# Patient Record
Sex: Female | Born: 1960 | Race: White | Hispanic: No | Marital: Single | Smoking: Current every day smoker
Health system: Southern US, Community
[De-identification: ages and names within clinical notes are randomized; demographics above are authoritative.]

## PROBLEM LIST (undated history)

## (undated) DIAGNOSIS — I10 Essential (primary) hypertension: Secondary | ICD-10-CM

## (undated) DIAGNOSIS — M069 Rheumatoid arthritis, unspecified: Secondary | ICD-10-CM

## (undated) DIAGNOSIS — F17200 Nicotine dependence, unspecified, uncomplicated: Secondary | ICD-10-CM

## (undated) DIAGNOSIS — I2510 Atherosclerotic heart disease of native coronary artery without angina pectoris: Secondary | ICD-10-CM

## (undated) DIAGNOSIS — E7800 Pure hypercholesterolemia, unspecified: Secondary | ICD-10-CM

## (undated) HISTORY — DX: Atherosclerotic heart disease of native coronary artery without angina pectoris: I25.10

## (undated) HISTORY — PX: ABDOMINAL HYSTERECTOMY: SHX81

## (undated) HISTORY — DX: Pure hypercholesterolemia, unspecified: E78.00

## (undated) HISTORY — DX: Nicotine dependence, unspecified, uncomplicated: F17.200

## (undated) HISTORY — PX: CERVICAL FUSION: SHX112

---

## 2001-03-20 ENCOUNTER — Ambulatory Visit (HOSPITAL_COMMUNITY): Admission: RE | Admit: 2001-03-20 | Discharge: 2001-03-20 | Payer: Self-pay | Admitting: *Deleted

## 2001-03-20 ENCOUNTER — Encounter: Payer: Self-pay | Admitting: *Deleted

## 2001-04-16 ENCOUNTER — Other Ambulatory Visit: Admission: RE | Admit: 2001-04-16 | Discharge: 2001-04-16 | Payer: Self-pay | Admitting: *Deleted

## 2001-04-22 ENCOUNTER — Other Ambulatory Visit: Admission: RE | Admit: 2001-04-22 | Discharge: 2001-04-22 | Payer: Self-pay | Admitting: General Surgery

## 2001-04-23 ENCOUNTER — Encounter: Payer: Self-pay | Admitting: General Surgery

## 2001-04-23 ENCOUNTER — Ambulatory Visit (HOSPITAL_COMMUNITY): Admission: RE | Admit: 2001-04-23 | Discharge: 2001-04-23 | Payer: Self-pay | Admitting: Pediatrics

## 2002-04-10 ENCOUNTER — Encounter: Payer: Self-pay | Admitting: Family Medicine

## 2002-04-10 ENCOUNTER — Ambulatory Visit (HOSPITAL_COMMUNITY): Admission: RE | Admit: 2002-04-10 | Discharge: 2002-04-10 | Payer: Self-pay | Admitting: Family Medicine

## 2002-04-23 ENCOUNTER — Other Ambulatory Visit: Admission: RE | Admit: 2002-04-23 | Discharge: 2002-04-23 | Payer: Self-pay | Admitting: Obstetrics & Gynecology

## 2003-02-12 ENCOUNTER — Encounter: Payer: Self-pay | Admitting: General Surgery

## 2003-02-12 ENCOUNTER — Ambulatory Visit (HOSPITAL_COMMUNITY): Admission: RE | Admit: 2003-02-12 | Discharge: 2003-02-12 | Payer: Self-pay | Admitting: General Surgery

## 2003-06-02 ENCOUNTER — Encounter: Payer: Self-pay | Admitting: Obstetrics & Gynecology

## 2003-06-02 ENCOUNTER — Ambulatory Visit (HOSPITAL_COMMUNITY): Admission: RE | Admit: 2003-06-02 | Discharge: 2003-06-02 | Payer: Self-pay | Admitting: Obstetrics & Gynecology

## 2004-06-09 ENCOUNTER — Ambulatory Visit (HOSPITAL_COMMUNITY): Admission: RE | Admit: 2004-06-09 | Discharge: 2004-06-09 | Payer: Self-pay | Admitting: Obstetrics & Gynecology

## 2005-07-06 ENCOUNTER — Emergency Department (HOSPITAL_COMMUNITY): Admission: EM | Admit: 2005-07-06 | Discharge: 2005-07-06 | Payer: Self-pay | Admitting: Emergency Medicine

## 2005-07-16 ENCOUNTER — Ambulatory Visit: Payer: Self-pay | Admitting: Orthopedic Surgery

## 2005-07-18 ENCOUNTER — Ambulatory Visit (HOSPITAL_COMMUNITY): Admission: RE | Admit: 2005-07-18 | Discharge: 2005-07-18 | Payer: Self-pay | Admitting: Family Medicine

## 2006-04-08 ENCOUNTER — Emergency Department (HOSPITAL_COMMUNITY): Admission: EM | Admit: 2006-04-08 | Discharge: 2006-04-08 | Payer: Self-pay | Admitting: Emergency Medicine

## 2006-09-20 ENCOUNTER — Ambulatory Visit: Payer: Self-pay | Admitting: Internal Medicine

## 2006-09-20 ENCOUNTER — Encounter (INDEPENDENT_AMBULATORY_CARE_PROVIDER_SITE_OTHER): Payer: Self-pay | Admitting: Specialist

## 2006-09-20 ENCOUNTER — Ambulatory Visit (HOSPITAL_COMMUNITY): Admission: RE | Admit: 2006-09-20 | Discharge: 2006-09-20 | Payer: Self-pay | Admitting: Internal Medicine

## 2006-09-26 ENCOUNTER — Ambulatory Visit (HOSPITAL_COMMUNITY): Admission: RE | Admit: 2006-09-26 | Discharge: 2006-09-26 | Payer: Self-pay | Admitting: Obstetrics & Gynecology

## 2007-05-06 ENCOUNTER — Emergency Department (HOSPITAL_COMMUNITY): Admission: EM | Admit: 2007-05-06 | Discharge: 2007-05-06 | Payer: Self-pay | Admitting: Emergency Medicine

## 2007-10-03 ENCOUNTER — Ambulatory Visit (HOSPITAL_COMMUNITY): Admission: RE | Admit: 2007-10-03 | Discharge: 2007-10-03 | Payer: Self-pay | Admitting: Obstetrics & Gynecology

## 2008-05-03 ENCOUNTER — Encounter (INDEPENDENT_AMBULATORY_CARE_PROVIDER_SITE_OTHER): Payer: Self-pay | Admitting: Family Medicine

## 2008-05-03 ENCOUNTER — Ambulatory Visit (HOSPITAL_COMMUNITY): Admission: RE | Admit: 2008-05-03 | Discharge: 2008-05-03 | Payer: Self-pay | Admitting: Family Medicine

## 2008-05-03 ENCOUNTER — Ambulatory Visit: Payer: Self-pay | Admitting: Cardiology

## 2008-06-02 ENCOUNTER — Ambulatory Visit (HOSPITAL_COMMUNITY): Payer: Self-pay | Admitting: Oncology

## 2008-10-13 ENCOUNTER — Ambulatory Visit (HOSPITAL_COMMUNITY): Admission: RE | Admit: 2008-10-13 | Discharge: 2008-10-13 | Payer: Self-pay | Admitting: Obstetrics & Gynecology

## 2009-10-24 ENCOUNTER — Ambulatory Visit (HOSPITAL_COMMUNITY): Admission: RE | Admit: 2009-10-24 | Discharge: 2009-10-24 | Payer: Self-pay | Admitting: Obstetrics & Gynecology

## 2010-06-14 ENCOUNTER — Encounter: Admission: RE | Admit: 2010-06-14 | Discharge: 2010-06-14 | Payer: Self-pay | Admitting: Neurosurgery

## 2010-08-21 ENCOUNTER — Encounter
Admission: RE | Admit: 2010-08-21 | Discharge: 2010-08-21 | Payer: Self-pay | Source: Home / Self Care | Attending: Neurosurgery | Admitting: Neurosurgery

## 2010-11-20 ENCOUNTER — Other Ambulatory Visit (HOSPITAL_COMMUNITY): Payer: Self-pay | Admitting: Obstetrics & Gynecology

## 2010-11-20 DIAGNOSIS — Z139 Encounter for screening, unspecified: Secondary | ICD-10-CM

## 2010-11-24 ENCOUNTER — Ambulatory Visit (HOSPITAL_COMMUNITY)
Admission: RE | Admit: 2010-11-24 | Discharge: 2010-11-24 | Disposition: A | Discharge status: Home / Self Care | Payer: 59 | Source: Ambulatory Visit | Attending: Obstetrics & Gynecology | Admitting: Obstetrics & Gynecology

## 2010-11-24 DIAGNOSIS — Z139 Encounter for screening, unspecified: Secondary | ICD-10-CM

## 2010-11-24 DIAGNOSIS — Z1231 Encounter for screening mammogram for malignant neoplasm of breast: Secondary | ICD-10-CM | POA: Insufficient documentation

## 2011-01-19 NOTE — Op Note (Signed)
NAME:  Kayla Castillo, Kayla Castillo             ACCOUNT NO.:  [id number]   MEDICAL RECORD NO.:  [id number]          PATIENT TYPE:  AMB   LOCATION:  DAY                           FACILITY:  [facility]   PHYSICIAN:  R. Roetta Sessions, M.D. DATE OF BIRTH:  Jul 30, 1961   DATE OF PROCEDURE:  09/20/2006  DATE OF DISCHARGE:                               OPERATIVE REPORT   PROCEDURE:  Screening colonoscopy with biopsy.   INDICATIONS FOR PROCEDURE:  The patient is a 50 year old lady referred  by Dr. Freida Busman for screening colonoscopy.  She tells me both of her older  sisters were diagnosed with precancer polyps in their 57s. [name]  does not have any lower GI tract symptoms.  There is no family history  of out and out colorectal cancer.  She has never had her lower GI tract  imaged.  Colonoscopy is now being done.  This approach has been  discussed with the patient at length.  Potential risks, benefits and  alternatives have been reviewed, questions answered.  She is agreeable.  Please see documentation in the medical record.   PROCEDURE NOTE:  O2 saturation, blood pressure, pulse and respirations  were monitored throughout the entire procedure.   CONSCIOUS SEDATION:  Versed 100 mg IV, Versed 5 mg IV in divided doses.   INSTRUMENT:  Pentax video chip system.   FINDINGS:  Digital rectal exam revealed no abnormalities.   ENDOSCOPIC FINDINGS:  The prep was good.  Examination of colonic mucosa  from the rectosigmoid junction through the left, transverse, right colon  to area of appendiceal orifice, ileocecal valve was undertaken.  These  structures were well seen and photographed for the record.  From this  level scope slowly cautiously withdrawn.  All previously mentioned  mucosal surfaces were again seen.  The colonic mucosa appeared entirely  normal.  Scope was pulled down to the rectum where thorough examination  of the rectal mucosa and retroflex view of anal verge revealed multiple  circumferential  verrucous, small three 4 mm  verrucous lesions  circumferential around the distal rectum consistent with condyloma type  lesions.  One was biopsied.  The patient tolerated the procedure well  was reacted in endoscopy.   IMPRESSION:  1. Verrucous appearing lesions distal rectum as described above,      suspicious for condyloma, biopsied, otherwise more proximal rectum      appeared normal.  2. Normal colon.   RECOMMENDATIONS:  1. Follow-up on path.  2. Further recommendations to follow.      Jonathon Bellows, M.D.  Electronically Signed     RMR/MEDQ  D:  09/20/2006  T:  09/20/2006  Job:  [id number]   cc:   Mila Homer. Sudie Bailey, M.D.  Fax: [phone]

## 2011-12-07 ENCOUNTER — Other Ambulatory Visit (HOSPITAL_COMMUNITY): Payer: Self-pay | Admitting: Obstetrics & Gynecology

## 2011-12-07 DIAGNOSIS — Z139 Encounter for screening, unspecified: Secondary | ICD-10-CM

## 2012-01-11 ENCOUNTER — Ambulatory Visit (HOSPITAL_COMMUNITY)
Admission: RE | Admit: 2012-01-11 | Discharge: 2012-01-11 | Disposition: A | Discharge status: Home / Self Care | Payer: 59 | Source: Ambulatory Visit | Attending: Obstetrics & Gynecology | Admitting: Obstetrics & Gynecology

## 2012-01-11 DIAGNOSIS — Z1231 Encounter for screening mammogram for malignant neoplasm of breast: Secondary | ICD-10-CM | POA: Insufficient documentation

## 2012-01-11 DIAGNOSIS — Z139 Encounter for screening, unspecified: Secondary | ICD-10-CM

## 2012-01-16 ENCOUNTER — Other Ambulatory Visit: Payer: Self-pay | Admitting: Obstetrics & Gynecology

## 2012-01-16 DIAGNOSIS — R928 Other abnormal and inconclusive findings on diagnostic imaging of breast: Secondary | ICD-10-CM

## 2012-01-17 ENCOUNTER — Other Ambulatory Visit: Payer: Self-pay | Admitting: Obstetrics & Gynecology

## 2012-01-17 DIAGNOSIS — R928 Other abnormal and inconclusive findings on diagnostic imaging of breast: Secondary | ICD-10-CM

## 2012-01-24 ENCOUNTER — Ambulatory Visit
Admission: RE | Admit: 2012-01-24 | Discharge: 2012-01-24 | Disposition: A | Discharge status: Home / Self Care | Payer: 59 | Source: Ambulatory Visit | Attending: Obstetrics & Gynecology | Admitting: Obstetrics & Gynecology

## 2012-01-24 DIAGNOSIS — R928 Other abnormal and inconclusive findings on diagnostic imaging of breast: Secondary | ICD-10-CM

## 2012-02-18 ENCOUNTER — Encounter (HOSPITAL_COMMUNITY): Payer: Self-pay | Admitting: *Deleted

## 2012-02-18 ENCOUNTER — Emergency Department (HOSPITAL_COMMUNITY): Payer: 59

## 2012-02-18 ENCOUNTER — Emergency Department (HOSPITAL_COMMUNITY)
Admission: EM | Admit: 2012-02-18 | Discharge: 2012-02-18 | Disposition: A | Discharge status: Home / Self Care | Payer: 59 | Attending: Emergency Medicine | Admitting: Emergency Medicine

## 2012-02-18 DIAGNOSIS — L03116 Cellulitis of left lower limb: Secondary | ICD-10-CM

## 2012-02-18 DIAGNOSIS — M79609 Pain in unspecified limb: Secondary | ICD-10-CM | POA: Insufficient documentation

## 2012-02-18 DIAGNOSIS — Z981 Arthrodesis status: Secondary | ICD-10-CM | POA: Insufficient documentation

## 2012-02-18 DIAGNOSIS — F17200 Nicotine dependence, unspecified, uncomplicated: Secondary | ICD-10-CM | POA: Insufficient documentation

## 2012-02-18 LAB — DIFFERENTIAL
Basophils Absolute: 0 10*3/uL (ref 0.0–0.1)
Eosinophils Relative: 2 % (ref 0–5)
Lymphocytes Relative: 29 % (ref 12–46)
Neutro Abs: 6 10*3/uL (ref 1.7–7.7)

## 2012-02-18 LAB — CBC
Platelets: 326 10*3/uL (ref 150–400)
RDW: 13.6 % (ref 11.5–15.5)
WBC: 9.5 10*3/uL (ref 4.0–10.5)

## 2012-02-18 LAB — URIC ACID: Uric Acid, Serum: 4.3 mg/dL (ref 2.4–7.0)

## 2012-02-18 MED ORDER — DOXYCYCLINE HYCLATE 100 MG PO CAPS: 100.0000 mg | ORAL_CAPSULE | Freq: Two times a day (BID) | ORAL | Status: AC

## 2012-02-18 MED ORDER — IBUPROFEN 600 MG PO TABS: 600.0000 mg | ORAL_TABLET | Freq: Four times a day (QID) | ORAL | Status: AC | PRN

## 2012-02-18 MED ORDER — IBUPROFEN 800 MG PO TABS
800.0000 mg | ORAL_TABLET | Freq: Once | ORAL | Status: AC
  Administered 2012-02-18: 800 mg via ORAL
  Filled 2012-02-18: qty 1

## 2012-02-18 NOTE — ED Provider Notes (Signed)
History     CSN: [id number]  Arrival date & time 02/18/12  1511   First MD Initiated Contact with Patient 02/18/12 1551      Chief Complaint  Patient presents with  . Foot Pain    (Consider location/radiation/quality/duration/timing/severity/associated sxs/prior treatment) HPI Comments: Patient presents with pain,  Swelling and redness to the dorsum of her left foot since yesterday.  She denies injury.  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  She denies fevers,  Chills,  Nausea and vomiting.  She denies personal history of gout,  But states her father has this condition.  She has tried tylenol yesterday with mild improvement in pain.    Patient is a 51 y.o. female presenting with lower extremity pain. The history is provided by the patient.  Foot Pain Pertinent negatives include no chills, fever or numbness.    History reviewed. No pertinent past medical history.  Past Surgical History  Procedure Date  . Cervical fusion   . Abdominal hysterectomy     History reviewed. No pertinent family history.  History  Substance Use Topics  . Smoking status: Current Everyday Smoker  . Smokeless tobacco: Not on file  . Alcohol Use: Yes    OB History    Grav Para Term Preterm Abortions TAB SAB Ect Mult Living                  Review of Systems  Constitutional: Negative for fever and chills.  HENT: Negative for facial swelling.   Respiratory: Negative for shortness of breath and wheezing.   Genitourinary: Negative for difficulty urinating.  Skin: Positive for color change.  Neurological: Negative for numbness.    Allergies  Review of patient's allergies indicates no known allergies.  Home Medications  No current outpatient prescriptions on file.  BP 150/82  Pulse 87  Temp 98.1 F (36.7 C) (Oral)  Resp 20  Ht 5\' 7"  (1.702 m)  Wt 170 lb (77.111 kg)  BMI 26.63 kg/m2  SpO2  100%  Physical Exam  Constitutional: She appears well-developed and well-nourished. No distress.  HENT:  Head: Normocephalic.  Neck: Neck supple.  Cardiovascular: Normal rate.   Pulses:      Dorsalis pedis pulses are 2+ on the right side, and 2+ on the left side.  Pulmonary/Chest: Effort normal. She has no wheezes.  Musculoskeletal: Normal range of motion. She exhibits no edema.  Skin: There is erythema.       Mild ttp and erythema with slight non pitting edema limited to dorsum of left foot.  No pain with palpation of anterior or posterior ankle and calf.  No red streaking.        ED Course  Procedures (including critical care time)   Labs Reviewed  CBC  DIFFERENTIAL  URIC ACID   Dg Foot Complete Left  02/18/2012  *RADIOLOGY REPORT*  Clinical Data: Swelling and foot pain  LEFT FOOT - COMPLETE 3+ VIEW  Comparison: None  Findings: Question bone island proximal phalanx great toe. Osseous mineralization otherwise normal. Joint spaces preserved. No acute fracture, dislocation, or bone destruction. Question mild dorsal soft tissue swelling overlying distal talus talus.  IMPRESSION: No acute osseous abnormalities.  Original Report Authenticated By: Lollie Marrow, M.D.     No diagnosis found.    MDM  Suspect possible early foot cellulitis.  CBC normal,  Pending uric acid level.  Tammy Triplett to  disposition pt once uric acid level resulted.        Burgess Amor, [place] 02/18/12 1731

## 2012-02-18 NOTE — Discharge Instructions (Signed)

## 2012-02-18 NOTE — ED Notes (Signed)
Pain lt foot . No injury

## 2012-02-26 NOTE — ED Provider Notes (Signed)
Medical screening examination/treatment/procedure(s) were performed by non-physician practitioner and as supervising physician I was immediately available for consultation/collaboration.  Adelayde Minney, MD 02/26/12 1738 

## 2012-11-21 ENCOUNTER — Other Ambulatory Visit: Payer: Self-pay | Admitting: Neurosurgery

## 2012-11-21 DIAGNOSIS — M542 Cervicalgia: Secondary | ICD-10-CM

## 2012-11-21 DIAGNOSIS — M47812 Spondylosis without myelopathy or radiculopathy, cervical region: Secondary | ICD-10-CM

## 2012-11-21 DIAGNOSIS — R200 Anesthesia of skin: Secondary | ICD-10-CM

## 2012-11-27 ENCOUNTER — Ambulatory Visit
Admission: RE | Admit: 2012-11-27 | Discharge: 2012-11-27 | Disposition: A | Discharge status: Home / Self Care | Payer: 59 | Source: Ambulatory Visit | Attending: Neurosurgery | Admitting: Neurosurgery

## 2012-11-27 DIAGNOSIS — M47812 Spondylosis without myelopathy or radiculopathy, cervical region: Secondary | ICD-10-CM

## 2012-11-27 DIAGNOSIS — R200 Anesthesia of skin: Secondary | ICD-10-CM

## 2012-11-27 DIAGNOSIS — M542 Cervicalgia: Secondary | ICD-10-CM

## 2013-01-28 ENCOUNTER — Other Ambulatory Visit (HOSPITAL_COMMUNITY): Payer: Self-pay | Admitting: Obstetrics & Gynecology

## 2013-01-28 DIAGNOSIS — Z139 Encounter for screening, unspecified: Secondary | ICD-10-CM

## 2013-01-30 ENCOUNTER — Ambulatory Visit (HOSPITAL_COMMUNITY)
Admission: RE | Admit: 2013-01-30 | Discharge: 2013-01-30 | Disposition: A | Discharge status: Home / Self Care | Payer: 59 | Source: Ambulatory Visit | Attending: Obstetrics & Gynecology | Admitting: Obstetrics & Gynecology

## 2013-01-30 DIAGNOSIS — Z139 Encounter for screening, unspecified: Secondary | ICD-10-CM

## 2013-01-30 DIAGNOSIS — Z1231 Encounter for screening mammogram for malignant neoplasm of breast: Secondary | ICD-10-CM | POA: Insufficient documentation

## 2014-04-26 ENCOUNTER — Other Ambulatory Visit (HOSPITAL_COMMUNITY): Payer: Self-pay | Admitting: Obstetrics & Gynecology

## 2014-04-26 DIAGNOSIS — Z1231 Encounter for screening mammogram for malignant neoplasm of breast: Secondary | ICD-10-CM

## 2014-04-28 ENCOUNTER — Ambulatory Visit (HOSPITAL_COMMUNITY): Payer: 59

## 2016-09-11 DIAGNOSIS — M47816 Spondylosis without myelopathy or radiculopathy, lumbar region: Secondary | ICD-10-CM | POA: Diagnosis not present

## 2016-09-11 DIAGNOSIS — M4302 Spondylolysis, cervical region: Secondary | ICD-10-CM | POA: Diagnosis not present

## 2016-12-20 DIAGNOSIS — M4302 Spondylolysis, cervical region: Secondary | ICD-10-CM | POA: Diagnosis not present

## 2016-12-20 DIAGNOSIS — M47816 Spondylosis without myelopathy or radiculopathy, lumbar region: Secondary | ICD-10-CM | POA: Diagnosis not present

## 2017-01-07 DIAGNOSIS — Z01419 Encounter for gynecological examination (general) (routine) without abnormal findings: Secondary | ICD-10-CM | POA: Diagnosis not present

## 2017-01-07 DIAGNOSIS — Z1231 Encounter for screening mammogram for malignant neoplasm of breast: Secondary | ICD-10-CM | POA: Diagnosis not present

## 2017-03-07 DIAGNOSIS — M4302 Spondylolysis, cervical region: Secondary | ICD-10-CM | POA: Diagnosis not present

## 2017-03-07 DIAGNOSIS — M47816 Spondylosis without myelopathy or radiculopathy, lumbar region: Secondary | ICD-10-CM | POA: Diagnosis not present

## 2017-06-18 DIAGNOSIS — M47812 Spondylosis without myelopathy or radiculopathy, cervical region: Secondary | ICD-10-CM | POA: Diagnosis not present

## 2017-09-17 DIAGNOSIS — M47812 Spondylosis without myelopathy or radiculopathy, cervical region: Secondary | ICD-10-CM | POA: Diagnosis not present

## 2017-09-17 DIAGNOSIS — M47816 Spondylosis without myelopathy or radiculopathy, lumbar region: Secondary | ICD-10-CM | POA: Diagnosis not present

## 2017-12-11 DIAGNOSIS — M4302 Spondylolysis, cervical region: Secondary | ICD-10-CM | POA: Diagnosis not present

## 2017-12-11 DIAGNOSIS — M47816 Spondylosis without myelopathy or radiculopathy, lumbar region: Secondary | ICD-10-CM | POA: Diagnosis not present

## 2017-12-11 DIAGNOSIS — Z0000 Encounter for general adult medical examination without abnormal findings: Secondary | ICD-10-CM | POA: Diagnosis not present

## 2017-12-11 DIAGNOSIS — N390 Urinary tract infection, site not specified: Secondary | ICD-10-CM | POA: Diagnosis not present

## 2017-12-11 DIAGNOSIS — Z7689 Persons encountering health services in other specified circumstances: Secondary | ICD-10-CM | POA: Diagnosis not present

## 2017-12-18 DIAGNOSIS — E039 Hypothyroidism, unspecified: Secondary | ICD-10-CM | POA: Diagnosis not present

## 2017-12-18 DIAGNOSIS — Z0000 Encounter for general adult medical examination without abnormal findings: Secondary | ICD-10-CM | POA: Diagnosis not present

## 2017-12-18 DIAGNOSIS — I10 Essential (primary) hypertension: Secondary | ICD-10-CM | POA: Diagnosis not present

## 2018-01-13 DIAGNOSIS — Z1231 Encounter for screening mammogram for malignant neoplasm of breast: Secondary | ICD-10-CM | POA: Diagnosis not present

## 2018-01-13 DIAGNOSIS — Z01419 Encounter for gynecological examination (general) (routine) without abnormal findings: Secondary | ICD-10-CM | POA: Diagnosis not present

## 2018-03-25 DIAGNOSIS — M47816 Spondylosis without myelopathy or radiculopathy, lumbar region: Secondary | ICD-10-CM | POA: Diagnosis not present

## 2018-03-25 DIAGNOSIS — M4302 Spondylolysis, cervical region: Secondary | ICD-10-CM | POA: Diagnosis not present

## 2018-07-01 DIAGNOSIS — M47816 Spondylosis without myelopathy or radiculopathy, lumbar region: Secondary | ICD-10-CM | POA: Diagnosis not present

## 2018-07-01 DIAGNOSIS — M4302 Spondylolysis, cervical region: Secondary | ICD-10-CM | POA: Diagnosis not present

## 2018-09-30 DIAGNOSIS — M47816 Spondylosis without myelopathy or radiculopathy, lumbar region: Secondary | ICD-10-CM | POA: Diagnosis not present

## 2018-09-30 DIAGNOSIS — M47812 Spondylosis without myelopathy or radiculopathy, cervical region: Secondary | ICD-10-CM | POA: Diagnosis not present

## 2018-10-14 DIAGNOSIS — M542 Cervicalgia: Secondary | ICD-10-CM | POA: Diagnosis not present

## 2018-10-14 DIAGNOSIS — I10 Essential (primary) hypertension: Secondary | ICD-10-CM | POA: Diagnosis not present

## 2018-10-14 DIAGNOSIS — S129XXA Fracture of neck, unspecified, initial encounter: Secondary | ICD-10-CM | POA: Diagnosis not present

## 2019-04-15 ENCOUNTER — Other Ambulatory Visit: Payer: Self-pay | Admitting: Obstetrics & Gynecology

## 2019-04-15 DIAGNOSIS — R928 Other abnormal and inconclusive findings on diagnostic imaging of breast: Secondary | ICD-10-CM

## 2019-04-17 ENCOUNTER — Ambulatory Visit
Admission: RE | Admit: 2019-04-17 | Discharge: 2019-04-17 | Disposition: A | Discharge status: Home / Self Care | Payer: Self-pay | Source: Ambulatory Visit | Attending: Obstetrics & Gynecology | Admitting: Obstetrics & Gynecology

## 2019-04-17 ENCOUNTER — Other Ambulatory Visit: Payer: Self-pay

## 2019-04-17 ENCOUNTER — Other Ambulatory Visit: Payer: Self-pay | Admitting: Obstetrics & Gynecology

## 2019-04-17 DIAGNOSIS — R928 Other abnormal and inconclusive findings on diagnostic imaging of breast: Secondary | ICD-10-CM

## 2019-04-17 DIAGNOSIS — N6320 Unspecified lump in the left breast, unspecified quadrant: Secondary | ICD-10-CM

## 2019-04-20 ENCOUNTER — Ambulatory Visit
Admission: RE | Admit: 2019-04-20 | Discharge: 2019-04-20 | Disposition: A | Discharge status: Home / Self Care | Payer: Self-pay | Source: Ambulatory Visit | Attending: Obstetrics & Gynecology | Admitting: Obstetrics & Gynecology

## 2019-04-20 ENCOUNTER — Other Ambulatory Visit: Payer: Self-pay

## 2019-04-20 DIAGNOSIS — N6320 Unspecified lump in the left breast, unspecified quadrant: Secondary | ICD-10-CM

## 2019-09-24 ENCOUNTER — Other Ambulatory Visit: Payer: Self-pay | Admitting: Obstetrics & Gynecology

## 2019-09-24 DIAGNOSIS — N6320 Unspecified lump in the left breast, unspecified quadrant: Secondary | ICD-10-CM

## 2019-10-19 ENCOUNTER — Other Ambulatory Visit: Payer: Self-pay

## 2019-10-29 ENCOUNTER — Ambulatory Visit
Admission: RE | Admit: 2019-10-29 | Discharge: 2019-10-29 | Disposition: A | Discharge status: Home / Self Care | Payer: 59 | Source: Ambulatory Visit | Attending: Obstetrics & Gynecology | Admitting: Obstetrics & Gynecology

## 2019-10-29 ENCOUNTER — Other Ambulatory Visit: Payer: Self-pay

## 2019-10-29 DIAGNOSIS — N6320 Unspecified lump in the left breast, unspecified quadrant: Secondary | ICD-10-CM

## 2020-04-04 ENCOUNTER — Other Ambulatory Visit: Payer: Self-pay

## 2020-04-04 ENCOUNTER — Ambulatory Visit
Admission: EM | Admit: 2020-04-04 | Discharge: 2020-04-04 | Disposition: A | Discharge status: Home / Self Care | Payer: 59

## 2020-04-04 DIAGNOSIS — M25461 Effusion, right knee: Secondary | ICD-10-CM

## 2020-04-04 DIAGNOSIS — M25561 Pain in right knee: Secondary | ICD-10-CM

## 2020-04-04 MED ORDER — PREDNISONE 10 MG (21) PO TBPK: ORAL_TABLET | Freq: Every day | ORAL | 0 refills | Status: DC

## 2020-04-04 NOTE — ED Triage Notes (Signed)
Pt presents with swelling in the right knee x 2 days and pain x 6 days. States she can not bend the right knee. Denies trauma. Advil gives somewhat relief.

## 2020-04-04 NOTE — Discharge Instructions (Signed)
Continue conservative management of rest, ice, and gentle stretches Prednisone prescribed.  Take as directed and to completion Knee brace given.  Wear as needed for comfort Follow up with orthopedist Return or go to the ER if you have any new or worsening symptoms (fever, chills, chest pain, redness, swelling, bruising, etc...)

## 2020-04-04 NOTE — ED Provider Notes (Signed)
[facility]   [id number] 04/04/20 Arrival Time: 1256  CC: RT knee PAIN  SUBJECTIVE: History from: patient. Kayla Castillo is a 59 y.o. female complains of RT knee pain and swelling x 6 days.  Denies a precipitating event or specific injury.  Localizes the pain to the front of knee.  Describes the pain as intermittent and achy in character.  Has tried OTC medications without relief.  Symptoms are made worse with walking.  Denies similar symptoms in the past.  Denies fever, chills, erythema, ecchymosis, weakness, numbness and tingling.    ROS: As per HPI.  All other pertinent ROS negative.     History reviewed. No pertinent past medical history. Past Surgical History:  Procedure Laterality Date  . ABDOMINAL HYSTERECTOMY    . CERVICAL FUSION     No Known Allergies No current facility-administered medications on file prior to encounter.   Current Outpatient Medications on File Prior to Encounter  Medication Sig Dispense Refill  . ibuprofen (ADVIL) 200 MG tablet Take 200 mg by mouth every 6 (six) hours as needed.    . hydrochlorothiazide (HYDRODIURIL) 25 MG tablet Take 25 mg by mouth daily.    . irbesartan (AVAPRO) 300 MG tablet Take 300 mg by mouth daily.    Marland Kitchen levothyroxine (SYNTHROID) 88 MCG tablet Take 88 mcg by mouth every morning.     Social History   Socioeconomic History  . Marital status: Single    Spouse name: Not on file  . Number of children: Not on file  . Years of education: Not on file  . Highest education level: Not on file  Occupational History  . Not on file  Tobacco Use  . Smoking status: Current Every Day Smoker  . Smokeless tobacco: Never Used  Substance and Sexual Activity  . Alcohol use: Yes  . Drug use: No  . Sexual activity: Yes    Birth control/protection: Surgical  Other Topics Concern  . Not on file  Social History Narrative  . Not on file   Social Determinants of Health   Financial Resource Strain:   . Difficulty of Paying  Living Expenses:   Food Insecurity:   . Worried About Programme researcher, broadcasting/film/video in the Last Year:   . Barista in the Last Year:   Transportation Needs:   . Freight forwarder (Medical):   Marland Kitchen Lack of Transportation (Non-Medical):   Physical Activity:   . Days of Exercise per Week:   . Minutes of Exercise per Session:   Stress:   . Feeling of Stress :   Social Connections:   . Frequency of Communication with Friends and Family:   . Frequency of Social Gatherings with Friends and Family:   . Attends Religious Services:   . Active Member of Clubs or Organizations:   . Attends Banker Meetings:   Marland Kitchen Marital Status:   Intimate Partner Violence:   . Fear of Current or Ex-Partner:   . Emotionally Abused:   Marland Kitchen Physically Abused:   . Sexually Abused:    History reviewed. No pertinent family history.  OBJECTIVE:  Vitals:   04/04/20 1308  BP: 106/64  Pulse: 78  Resp: 18  Temp: 98.1 F (36.7 C)  TempSrc: Oral  SpO2: 95%    General appearance: ALERT; in no acute distress.  Head: NCAT Lungs: Normal respiratory effort CV: Dorsalis pedis pulse 2+ Musculoskeletal: RT knee Inspection: diffuse swelling about the knee Palpation: TTP over anterior lateral joint  line as well as quad tendon ROM: FROM active and passive Strength: 5/5 knee flexion, 5/5 knee extension Stability: Anterior/ posterior drawer intact Skin: warm and dry Neurologic: Ambulates without difficulty; Sensation intact about the upper/ lower extremities Psychological: alert and cooperative; normal mood and affect   ASSESSMENT & PLAN:  1. Acute pain of right knee   2. Pain and swelling of right knee   3. Lateral joint line tenderness of knee, right     Meds ordered this encounter  Medications  . DISCONTD: predniSONE (STERAPRED UNI-PAK 21 TAB) 10 MG (21) TBPK tablet    Sig: Take by mouth daily. Take 6 tabs by mouth daily  for 2 days, then 5 tabs for 2 days, then 4 tabs for 2 days, then 3 tabs for  2 days, 2 tabs for 2 days, then 1 tab by mouth daily for 2 days    Dispense:  42 tablet    Refill:  0    Order Specific Question:   Supervising Provider    Answer:   Eustace Moore [[id number]]  . predniSONE (STERAPRED UNI-PAK 21 TAB) 10 MG (21) TBPK tablet    Sig: Take by mouth daily. Take 6 tabs by mouth daily  for 2 days, then 5 tabs for 2 days, then 4 tabs for 2 days, then 3 tabs for 2 days, 2 tabs for 2 days, then 1 tab by mouth daily for 2 days    Dispense:  42 tablet    Refill:  0    Order Specific Question:   Supervising Provider    Answer:   Eustace Moore [[id number]]    Continue conservative management of rest, ice, and gentle stretches Prednisone prescribed.  Take as directed and to completion Knee brace given.  Wear as needed for comfort Follow up with orthopedist Return or go to the ER if you have any new or worsening symptoms (fever, chills, chest pain, redness, swelling, bruising, etc...)    Reviewed expectations re: course of current medical issues. Questions answered. Outlined signs and symptoms indicating need for more acute intervention. Patient verbalized understanding. After Visit Summary given.    Rennis Harding, PA-C 04/04/20 1327

## 2021-11-18 ENCOUNTER — Other Ambulatory Visit: Payer: Self-pay

## 2021-11-18 ENCOUNTER — Ambulatory Visit (INDEPENDENT_AMBULATORY_CARE_PROVIDER_SITE_OTHER): Payer: 59

## 2021-11-18 ENCOUNTER — Ambulatory Visit
Admission: EM | Admit: 2021-11-18 | Discharge: 2021-11-18 | Disposition: A | Discharge status: Home / Self Care | Payer: 59

## 2021-11-18 DIAGNOSIS — M25531 Pain in right wrist: Secondary | ICD-10-CM

## 2021-11-18 DIAGNOSIS — M25431 Effusion, right wrist: Secondary | ICD-10-CM | POA: Diagnosis not present

## 2021-11-18 DIAGNOSIS — Z8261 Family history of arthritis: Secondary | ICD-10-CM | POA: Diagnosis not present

## 2021-11-18 DIAGNOSIS — M79641 Pain in right hand: Secondary | ICD-10-CM | POA: Diagnosis not present

## 2021-11-18 MED ORDER — PREDNISONE 50 MG PO TABS: 50.0000 mg | ORAL_TABLET | Freq: Every day | ORAL | 0 refills | Status: DC

## 2021-11-18 NOTE — Discharge Instructions (Addendum)
Please make sure you follow-up with your PCP to request a panel of blood work to look into inflammatory joint conditions like rheumatoid arthritis.  ?

## 2021-11-18 NOTE — ED Provider Notes (Signed)
?[facility] ? ? ?MRN: [id number] DOB: 1960-10-07 ? ?Subjective:  ? ?Kayla Castillo is a 61 y.o. female presenting for 3-day history of acute onset right hand/wrist pain with swelling.  She has significant difficulty moving the wrist.  No fall, trauma.  Has had similar issues with the left wrist.  Has actually undergone surgery for this including removal of certain carpal bones, cyst removal.  Has a family history of first-degree relatives with rheumatoid arthritis.  Has a PCP but has not been checked for rheumatoid arthritis.  She did use tramadol without any relief as this is prescribed for her left wrist pain.  She did undergo a prednisone course in February.  No history of diabetes. ? ?No current facility-administered medications for this encounter. ? ?Current Outpatient Medications:  ?  hydrochlorothiazide (HYDRODIURIL) 25 MG tablet, Take 25 mg by mouth daily., Disp: , Rfl:  ?  ibuprofen (ADVIL) 200 MG tablet, Take 200 mg by mouth every 6 (six) hours as needed., Disp: , Rfl:  ?  irbesartan (AVAPRO) 300 MG tablet, Take 300 mg by mouth daily., Disp: , Rfl:  ?  levothyroxine (SYNTHROID) 88 MCG tablet, Take 88 mcg by mouth every morning., Disp: , Rfl:  ?  traMADol (ULTRAM) 50 MG tablet, Take 50 mg by mouth., Disp: , Rfl:   ? ?No Known Allergies ? ?History reviewed. No pertinent past medical history.  ? ?Past Surgical History:  ?Procedure Laterality Date  ? ABDOMINAL HYSTERECTOMY    ? CERVICAL FUSION    ? ? ?History reviewed. No pertinent family history. ? ?Social History  ? ?Tobacco Use  ? Smoking status: Every Day  ? Smokeless tobacco: Never  ?Substance Use Topics  ? Alcohol use: Yes  ? Drug use: No  ? ? ?ROS ? ? ?Objective:  ? ?Vitals: ?BP 134/87 (BP Location: Right Arm)   Pulse 90   Temp 98.3 ?F (36.8 ?C) (Oral)   Resp 18   SpO2 97%  ? ?Physical Exam ?Constitutional:   ?   General: She is not in acute distress. ?   Appearance: Normal appearance. She is well-developed. She is not  ill-appearing, toxic-appearing or diaphoretic.  ?HENT:  ?   Head: Normocephalic and atraumatic.  ?   Nose: Nose normal.  ?   Mouth/Throat:  ?   Mouth: Mucous membranes are moist.  ?Eyes:  ?   General: No scleral icterus.    ?   Right eye: No discharge.     ?   Left eye: No discharge.  ?   Extraocular Movements: Extraocular movements intact.  ?Cardiovascular:  ?   Rate and Rhythm: Normal rate.  ?Pulmonary:  ?   Effort: Pulmonary effort is normal.  ?Musculoskeletal:  ?   Right wrist: Swelling, tenderness, bony tenderness and snuff box tenderness present. No deformity, effusion, lacerations or crepitus. Decreased range of motion.  ?Skin: ?   General: Skin is warm and dry.  ?Neurological:  ?   General: No focal deficit present.  ?   Mental Status: She is alert and oriented to person, place, and time.  ?Psychiatric:     ?   Mood and Affect: Mood normal.     ?   Behavior: Behavior normal.     ?   Thought Content: Thought content normal.     ?   Judgment: Judgment normal.  ? ?DG Wrist Complete Right ? ?Result Date: 11/18/2021 ?CLINICAL DATA:  Wrist pain and swelling. EXAM: RIGHT WRIST - COMPLETE 3+ VIEW COMPARISON:  None. FINDINGS: There is no evidence of fracture or dislocation. There is no evidence of arthropathy or other focal bone abnormality. Soft tissues are unremarkable. IMPRESSION: Negative. Electronically Signed   By: Kennith Center M.D.   On: 11/18/2021 11:27   ? ?Assessment and Plan :  ? ?PDMP not reviewed this encounter. ? ?1. Pain and swelling of right wrist   ?2. Right hand pain   ?3. Family history of rheumatoid arthritis   ? ?Recommended oral prednisone course for inflammatory type pain. Follow up with PCP asap for continued work up. Counseled patient on potential for adverse effects with medications prescribed/recommended today, ER and return-to-clinic precautions discussed, patient verbalized understanding. ? ?  ?Wallis Bamberg, PA-C ?11/18/21 1133 ? ?

## 2021-11-18 NOTE — ED Triage Notes (Signed)
Pt reports right wrist pan and swelling x 3 days. Pain is worse when moving right thumb. States happened the same in the  left wrist when she slept over. Tramadol gives no relief.  ?

## 2021-12-05 ENCOUNTER — Other Ambulatory Visit: Payer: Self-pay | Admitting: Rheumatology

## 2021-12-05 ENCOUNTER — Ambulatory Visit
Admission: RE | Admit: 2021-12-05 | Discharge: 2021-12-05 | Disposition: A | Discharge status: Home / Self Care | Payer: 59 | Source: Ambulatory Visit | Attending: Rheumatology | Admitting: Rheumatology

## 2021-12-05 DIAGNOSIS — M7989 Other specified soft tissue disorders: Secondary | ICD-10-CM

## 2022-01-18 LAB — COLOGUARD

## 2022-01-28 ENCOUNTER — Encounter (HOSPITAL_COMMUNITY): Payer: Self-pay | Admitting: Emergency Medicine

## 2022-01-28 ENCOUNTER — Emergency Department (HOSPITAL_COMMUNITY)
Admission: EM | Admit: 2022-01-28 | Discharge: 2022-01-28 | Disposition: A | Discharge status: Home / Self Care | Payer: 59 | Attending: Emergency Medicine | Admitting: Emergency Medicine

## 2022-01-28 ENCOUNTER — Other Ambulatory Visit: Payer: Self-pay

## 2022-01-28 DIAGNOSIS — R21 Rash and other nonspecific skin eruption: Secondary | ICD-10-CM | POA: Diagnosis present

## 2022-01-28 DIAGNOSIS — E876 Hypokalemia: Secondary | ICD-10-CM

## 2022-01-28 DIAGNOSIS — E039 Hypothyroidism, unspecified: Secondary | ICD-10-CM | POA: Insufficient documentation

## 2022-01-28 DIAGNOSIS — I10 Essential (primary) hypertension: Secondary | ICD-10-CM | POA: Insufficient documentation

## 2022-01-28 DIAGNOSIS — Z79899 Other long term (current) drug therapy: Secondary | ICD-10-CM | POA: Insufficient documentation

## 2022-01-28 DIAGNOSIS — T7840XA Allergy, unspecified, initial encounter: Secondary | ICD-10-CM

## 2022-01-28 DIAGNOSIS — T451X5A Adverse effect of antineoplastic and immunosuppressive drugs, initial encounter: Secondary | ICD-10-CM | POA: Insufficient documentation

## 2022-01-28 HISTORY — DX: Rheumatoid arthritis, unspecified: M06.9

## 2022-01-28 HISTORY — DX: Essential (primary) hypertension: I10

## 2022-01-28 LAB — BASIC METABOLIC PANEL
Anion gap: 9 (ref 5–15)
BUN: 13 mg/dL (ref 8–23)
CO2: 23 mmol/L (ref 22–32)
Calcium: 9.3 mg/dL (ref 8.9–10.3)
Chloride: 108 mmol/L (ref 98–111)
Creatinine, Ser: 0.75 mg/dL (ref 0.44–1.00)
GFR, Estimated: >60 mL/min (ref >60)
Glucose, Bld: 129 mg/dL — ABNORMAL HIGH (ref 70–99)
Potassium: 3.1 mmol/L — ABNORMAL LOW (ref 3.5–5.1)
Sodium: 140 mmol/L (ref 135–145)

## 2022-01-28 LAB — EXTERNAL GENERIC LAB PROCEDURE: COLOGUARD: NEGATIVE

## 2022-01-28 LAB — COLOGUARD: COLOGUARD: NEGATIVE

## 2022-01-28 MED ORDER — DIPHENHYDRAMINE HCL 50 MG/ML IJ SOLN
50.0000 mg | Freq: Once | INTRAMUSCULAR | Status: AC
  Administered 2022-01-28: 50 mg via INTRAMUSCULAR
  Filled 2022-01-28: qty 1

## 2022-01-28 MED ORDER — POTASSIUM CHLORIDE ER 10 MEQ PO TBCR: 10.0000 meq | EXTENDED_RELEASE_TABLET | Freq: Every day | ORAL | 0 refills | Status: DC

## 2022-01-28 MED ORDER — PREDNISONE 20 MG PO TABS: ORAL_TABLET | ORAL | 0 refills | Status: AC

## 2022-01-28 MED ORDER — HYDROCHLOROTHIAZIDE 25 MG PO TABS
25.0000 mg | ORAL_TABLET | Freq: Every day | ORAL | Status: DC
  Administered 2022-01-28: 25 mg via ORAL
  Filled 2022-01-28: qty 1

## 2022-01-28 MED ORDER — HYDROXYZINE HCL 25 MG PO TABS
25.0000 mg | ORAL_TABLET | Freq: Once | ORAL | Status: AC
  Administered 2022-01-28: 25 mg via ORAL
  Filled 2022-01-28: qty 1

## 2022-01-28 MED ORDER — DEXAMETHASONE SODIUM PHOSPHATE 10 MG/ML IJ SOLN
10.0000 mg | Freq: Once | INTRAMUSCULAR | Status: AC
  Administered 2022-01-28: 10 mg via INTRAMUSCULAR
  Filled 2022-01-28: qty 1

## 2022-01-28 MED ORDER — IRBESARTAN 300 MG PO TABS
300.0000 mg | ORAL_TABLET | Freq: Every day | ORAL | Status: DC
  Administered 2022-01-28: 300 mg via ORAL
  Filled 2022-01-28 (×2): qty 1

## 2022-01-28 NOTE — ED Triage Notes (Addendum)
Pt has rheumatoid arthritis and was allergic to methotrexate.  Started Humira on 5/19 and rash started on arms on Wednesday and has now spread all over.  Reports bilateral hand pain today.  Hypertensive on arrival.  Pt has BP medication at home but was told to stop taking it because BP had been lower.

## 2022-01-28 NOTE — Discharge Instructions (Addendum)
Thank you for allowing [place] to treat you in the emergency department today.  After reviewing your examination and potential testing that was done it appears that you are safe to go home.  I would like for you to follow-up with your doctor within the next several days, have them obtain your results and follow-up with them to review all of these tests.  If you should develop severe or worsening symptoms return to the emergency department immediately  Please take Benadryl up to 50 mg every 6 hours as needed for severe itching however this may cause you to have some palpitations, dry mouth, increased sleepiness.  I would also like for you to take prednisone daily for the next 10 days including 40 mg a day for 5 days then 20 mg a day for 5 days.  It may be that the cause of this rash is actually what is called a drug eruption which means that it may stick around for several weeks, if you develop severe or worsening rash, any blistering, any involvement of your tongue or the inside of your cheeks, your eyes or any blistering on your skin you will need to come back to the ER immediately otherwise share some pictures with your family doctor, let them know that this started after the Humira and you should not take Humira anymore.

## 2022-01-28 NOTE — ED Provider Notes (Signed)
[facility] EMERGENCY DEPARTMENT Provider Note   CSN: [id number] Arrival date & time: 01/28/22  1113     History  Chief Complaint  Patient presents with   Medication Reaction    Kayla Castillo is a 61 y.o. female.  HPI  This patient is a 61 year old female history of hypertension and hypothyroidism who was recently diagnosed with rheumatoid arthritis by rheumatology, unfortunately those results are not available after my review of the medical record.  She reports that she was given methotrexate but had an allergic reaction and this had to be discontinued.  Her rash that she had after the methotrexate completely resolved, she was placed on leflunomide and then started on Humira which she received as an injection on the 19th in her right thigh.  The patient reports that starting about 5 days ago she developed a rash on the back of her neck, minimally urticarial or pruritic, since that time it is spread involving bilateral upper extremities her complete trunk a little bit of her forehead and a small amount on her thighs.  It is now feeling like the palms of her hands are little bit swollen as well.  This is all in relation to right wrist pain which was thought to be rheumatoid.  She is followed by rheumatology as well as a family doctor, she has not been seen in person this week.  She had some leftover prednisone at home and took a dose last night, it has not really helped.  She has no shortness of breath or swelling of her airway.  No fevers or chills, no other complaints  Home Medications Prior to Admission medications   Medication Sig Start Date End Date Taking? Authorizing Provider  predniSONE (DELTASONE) 20 MG tablet 40mg  PO daily for 5 days, then 20mg  PO for 5 days Disp QS 01/28/22  Yes Noemi Chapel, MD  hydrochlorothiazide (HYDRODIURIL) 25 MG tablet Take 25 mg by mouth daily. 03/08/20   [provider]  ibuprofen (ADVIL) 200 MG tablet Take 200 mg by  mouth every 6 (six) hours as needed.    [provider]  irbesartan (AVAPRO) 300 MG tablet Take 300 mg by mouth daily. 03/08/20   [provider]  levothyroxine (SYNTHROID) 88 MCG tablet Take 88 mcg by mouth every morning. 03/08/20   [provider]  traMADol (ULTRAM) 50 MG tablet Take 50 mg by mouth. 11/15/21   [provider]      Allergies    Patient has no known allergies.    Review of Systems   Review of Systems  All other systems reviewed and are negative.  Physical Exam Updated Vital Signs BP (!) 142/70   Pulse 80   Temp 98.3 F (36.8 C) (Oral)   Resp 18   Ht 1.702 m (5\' 7" )   Wt 77.1 kg   SpO2 98%   BMI 26.63 kg/m  Physical Exam Vitals and nursing note reviewed.  Constitutional:      General: She is not in acute distress.    Appearance: She is well-developed.  HENT:     Head: Normocephalic and atraumatic.     Mouth/Throat:     Pharynx: No oropharyngeal exudate.  Eyes:     General: No scleral icterus.       Right eye: No discharge.        Left eye: No discharge.     Conjunctiva/sclera: Conjunctivae normal.     Pupils: Pupils are equal, round, and reactive to light.  Neck:     Thyroid: No thyromegaly.     Vascular: No JVD.  Cardiovascular:     Rate and Rhythm: Normal rate and regular rhythm.     Heart sounds: Normal heart sounds. No murmur heard.   No friction rub. No gallop.  Pulmonary:     Effort: Pulmonary effort is normal. No respiratory distress.     Breath sounds: Normal breath sounds. No wheezing or rales.  Abdominal:     General: Bowel sounds are normal. There is no distension.     Palpations: Abdomen is soft. There is no mass.     Tenderness: There is no abdominal tenderness.  Musculoskeletal:        General: No tenderness. Normal range of motion.     Cervical back: Normal range of motion and neck supple.  Lymphadenopathy:     Cervical: No cervical adenopathy.  Skin:    General: Skin is warm and dry.      Findings: Erythema and rash present.     Comments: There is a macular rash which is blanching and minimally pruritic without any vesicles or pustules or petechiae or purpura that is covering 95% of her trunk anterior and posterior, posterior neck, scattered across the forehead, volar and extensor surfaces of the bilateral arms and forearms but no visible rash to the hands, the lower extremities are essentially spared below the knees, there is no mucous membrane involvement  Neurological:     General: No focal deficit present.     Mental Status: She is alert.     Coordination: Coordination normal.  Psychiatric:        Behavior: Behavior normal.    ED Results / Procedures / Treatments   Labs (all labs ordered are listed, but only abnormal results are displayed) Labs Reviewed  BASIC METABOLIC PANEL - Abnormal; Notable for the following components:      Result Value   Potassium 3.1 (*)    Glucose, Bld 129 (*)    All other components within normal limits    EKG None  Radiology No results found.  Procedures Procedures    Medications Ordered in ED Medications  irbesartan (AVAPRO) tablet 300 mg (300 mg Oral Given 01/28/22 1156)  hydrochlorothiazide (HYDRODIURIL) tablet 25 mg (25 mg Oral Given 01/28/22 1156)  diphenhydrAMINE (BENADRYL) injection 50 mg (has no administration in time range)  dexamethasone (DECADRON) injection 10 mg (10 mg Intramuscular Given 01/28/22 1157)  hydrOXYzine (ATARAX) tablet 25 mg (25 mg Oral Given 01/28/22 1156)    ED Course/ Medical Decision Making/ A&P                           Medical Decision Making Amount and/or Complexity of Data Reviewed Labs: ordered.  Risk Prescription drug management.   Doubt Stevens-Johnson syndrome or toxic epidermal necrolysis, has diffuse involvement of a rash which may be due to a drug eruption or potentially urticaria though she her itching is minimal making this seem much less likely.  Will be given a dose of  Decadron.    As a secondary symptom the patient is hypertensive at 201/83 and was recently advised to stop taking her antihypertensives, at this time she likely needs for this to be restarted as well.  The patient is agreeable to the plan and will likely need close follow-up outpatient testing, she will likely need to stop taking Humira, she expressed understanding  Improved with  meds No signs of anaphylaxis She has  BP meds at home and will take 1/2 dose of ARB, pt is agreeable.  Vitals:   01/28/22 1215 01/28/22 1230 01/28/22 1300 01/28/22 1330  BP: (!) 141/66 125/66 122/63 (!) 142/70  Pulse:    80  Resp:    18  Temp:      TempSrc:      SpO2:    98%  Weight:      Height:               Final Clinical Impression(s) / ED Diagnoses Final diagnoses:  Allergic reaction, initial encounter    Rx / DC Orders ED Discharge Orders          Ordered    predniSONE (DELTASONE) 20 MG tablet        01/28/22 1354              Noemi Chapel, MD 01/28/22 1357

## 2022-02-22 ENCOUNTER — Other Ambulatory Visit: Payer: Self-pay | Admitting: Registered Nurse

## 2022-02-22 DIAGNOSIS — E782 Mixed hyperlipidemia: Secondary | ICD-10-CM

## 2022-04-20 ENCOUNTER — Ambulatory Visit
Admission: RE | Admit: 2022-04-20 | Discharge: 2022-04-20 | Disposition: A | Discharge status: Home / Self Care | Payer: No Typology Code available for payment source | Source: Ambulatory Visit | Attending: Registered Nurse | Admitting: Registered Nurse

## 2022-04-20 ENCOUNTER — Other Ambulatory Visit: Payer: 59

## 2022-04-20 DIAGNOSIS — E782 Mixed hyperlipidemia: Secondary | ICD-10-CM

## 2022-04-26 ENCOUNTER — Other Ambulatory Visit: Payer: 59

## 2022-05-28 ENCOUNTER — Ambulatory Visit: Payer: 59 | Attending: Interventional Cardiology | Admitting: Interventional Cardiology

## 2022-05-28 ENCOUNTER — Encounter: Payer: Self-pay | Admitting: Interventional Cardiology

## 2022-05-28 VITALS — BP 120/68 | HR 102 | Ht 67.0 in | Wt 190.2 lb

## 2022-05-28 DIAGNOSIS — I10 Essential (primary) hypertension: Secondary | ICD-10-CM | POA: Diagnosis not present

## 2022-05-28 DIAGNOSIS — E785 Hyperlipidemia, unspecified: Secondary | ICD-10-CM | POA: Diagnosis not present

## 2022-05-28 DIAGNOSIS — I2510 Atherosclerotic heart disease of native coronary artery without angina pectoris: Secondary | ICD-10-CM

## 2022-05-28 DIAGNOSIS — R931 Abnormal findings on diagnostic imaging of heart and coronary circulation: Secondary | ICD-10-CM

## 2022-05-28 DIAGNOSIS — Z720 Tobacco use: Secondary | ICD-10-CM

## 2022-05-28 MED ORDER — ROSUVASTATIN CALCIUM 40 MG PO TABS: 40.0000 mg | ORAL_TABLET | Freq: Every day | ORAL | 3 refills | Status: DC

## 2022-05-28 NOTE — Progress Notes (Signed)
Cardiology Office Note:    Date:  05/28/2022   ID:  Imberly, Troxler 10-24-1960, MRN [id number]  PCP:  Merrilee Seashore, MD  Cardiologist:  None   Referring MD: Holland Commons, FNP   Chief Complaint  Patient presents with   Coronary Artery Disease   Advice Only    PAD Possible carotid disease   Hypertension    History of Present Illness:    Kayla Castillo is a 61 y.o. female with a hx of CAD, primary hypertension, hyperlipidemia, tobacco dependence, and rheumatoid arthritis.  She is being seen for cardiology consultation concerning an elevated coronary calcium score of 450.  She is referred because of a high risk coronary calcium score greater than 450 Agostini units.  She denies angina pectoris and anginal equivalent symptoms.  She has not had any neurological symptoms or history of stroke.  She denies claudication.  She has difficulty ambulating due to rheumatoid arthritis diagnosed within the last 6 months that causes significant hip and knee pain when she ambulates.  She smokes more than a half a pack of cigarettes per day and has done so for greater than 40 years.  There is a family history of premature cerebrovascular disease.  She has a history of hypertension that is well controlled on medications.  After the coronary calcium score results were obtained rosuvastatin 20 mg/day was started in August.  No apparent side effects since starting rosuvastatin.  Past Medical History:  Diagnosis Date   CAD (coronary artery disease)    Hypertension    Pure hypercholesterolemia    Rheumatoid arthritis ([place])    Tobacco dependence     Past Surgical History:  Procedure Laterality Date   ABDOMINAL HYSTERECTOMY     CERVICAL FUSION      Current Medications: Current Meds  Medication Sig   Coenzyme Q10 (CO Q 10 PO) Take 1 tablet by mouth 2 (two) times daily.   hydrochlorothiazide (HYDRODIURIL) 25 MG tablet Take 25 mg by mouth daily.   ibuprofen  (ADVIL) 200 MG tablet Take 200 mg by mouth every 6 (six) hours as needed.   irbesartan (AVAPRO) 300 MG tablet Take 300 mg by mouth daily.   levothyroxine (SYNTHROID) 88 MCG tablet Take 88 mcg by mouth every morning.   predniSONE (DELTASONE) 20 MG tablet 40mg  PO daily for 5 days, then 20mg  PO for 5 days Disp QS   rosuvastatin (CRESTOR) 40 MG tablet Take 1 tablet (40 mg total) by mouth daily.   traMADol (ULTRAM) 50 MG tablet Take 50 mg by mouth.   [DISCONTINUED] rosuvastatin (CRESTOR) 20 MG tablet Take 20 mg by mouth at bedtime.     Allergies:   Adalimumab, Etanercept, Methotrexate, and Upadacitinib   Social History   Socioeconomic History   Marital status: Single    Spouse name: Not on file   Number of children: Not on file   Years of education: Not on file   Highest education level: Not on file  Occupational History   Not on file  Tobacco Use   Smoking status: Every Day   Smokeless tobacco: Never  Substance and Sexual Activity   Alcohol use: Yes   Drug use: No   Sexual activity: Yes    Birth control/protection: Surgical  Other Topics Concern   Not on file  Social History Narrative   Not on file   Social Determinants of Health   Financial Resource Strain: Not on file  Food Insecurity: Not on file  Transportation Needs:  Not on file  Physical Activity: Not on file  Stress: Not on file  Social Connections: Not on file     Family History: The patient's family history is not on file.  ROS:   Please see the history of present illness.    She feels tired a lot.  When she gets up to do things because of the bilateral hip discomfort she feels her legs get tired quickly.  All other systems reviewed and are negative.  EKGs/Labs/Other Studies Reviewed:    The following studies were reviewed today: CRP 9.63 March 2023 Erythrocyte sedimentation rate 61 March 2023  Coronary artery calcium score April 20, 2022:   IMPRESSION: Total Agatston score: 464   [place] database  percentile: 97   Aortic atherosclerosis    EKG:  EKG sinus tachycardia 102 bpm, nonspecific ST segment abnormality.  Likely rate related.  There is also left axis deviation.  Recent Labs: 01/28/2022: BUN 13; Creatinine, Ser 0.75; Potassium 3.1; Sodium 140  Recent Lipid Panel No results found for: "CHOL", "TRIG", "HDL", "CHOLHDL", "VLDL", "LDLCALC", "LDLDIRECT"  Physical Exam:    VS:  BP 120/68   Pulse (!) 102   Ht 5\' 7"  (1.702 m)   Wt 190 lb 3.2 oz (86.3 kg)   SpO2 98%   BMI 29.79 kg/m     Wt Readings from Last 3 Encounters:  05/28/22 190 lb 3.2 oz (86.3 kg)  01/28/22 170 lb (77.1 kg)  02/18/12 170 lb (77.1 kg)     GEN: Obese. No acute distress HEENT: Normal NECK: No JVD. LYMPHATICS: No lymphadenopathy CARDIAC: No murmur. RRR no gallop, or edema. VASCULAR:   Dorsalis pedis pulses are present bilaterally.  Popliteal pulses are difficult to palpate.  Femoral pulses are 2+ with faint bilateral bruits. RESPIRATORY:  Clear to auscultation without rales, wheezing or rhonchi  ABDOMEN: Diffuse abdominal bruits are heard. .  Soft, non-tender, non-distended, No pulsatile mass, MUSCULOSKELETAL: No deformity  SKIN: Warm and dry NEUROLOGIC:  Alert and oriented x 3 PSYCHIATRIC:  Normal affect   ASSESSMENT:    1. Coronary artery disease involving native coronary artery of native heart without angina pectoris   2. Elevated coronary artery calcium score   3. Primary hypertension   4. Hyperlipidemia LDL goal <70   5. Tobacco abuse    PLAN:    In order of problems listed above:  Asymptomatic coronary artery disease, and high risk category based upon the finding on coronary calcium score done recently.  In absence of angina there is no specific further diagnostic study that is needed.  I did educate concerning angina and anginal equivalent symptoms that should be reported.  She should start coated 81 mg aspirin daily.  We should treat LDL to less than 70 on high intensity statin  therapy and if not achievable at max dose add ezetimibe or consider switching to PCSK9 or Leqvio.  Advised her to increase rosuvastatin to 40 mg/day.  She has blood work coming up with her primary physician.  Patient call if any concerns about achieving target.  Advised her to stop smoking.  We discussed secondary prevention as noted below. Discussed above Controlled blood pressure on therapy.  Continue same which is Avapro and HCTZ. Max dose rosuvastatin 40 mg/day.  LDL target less than 70.  Add ezetimibe if remains above 70. Strongly encourage smoking cessation.  Overall education and awareness concerning primary/secondary risk prevention was discussed in detail: LDL less than 70, hemoglobin A1c less than 7, blood pressure target less  than 130/80 mmHg, >150 minutes of moderate aerobic activity per week, avoidance of smoking, weight control (via diet and exercise), and continued surveillance/management of/for obstructive sleep apnea.   Clinical follow-up with cardiology for any symptoms suggest claudication, neurological complaints, or angina.   Medication Adjustments/Labs and Tests Ordered: Current medicines are reviewed at length with the patient today.  Concerns regarding medicines are outlined above.  Orders Placed This Encounter  Procedures   EKG 12-Lead   Meds ordered this encounter  Medications   rosuvastatin (CRESTOR) 40 MG tablet    Sig: Take 1 tablet (40 mg total) by mouth daily.    Dispense:  90 tablet    Refill:  3    Patient Instructions  Medication Instructions:  Your physician has recommended you make the following change in your medication:  Increase Crestor/rosuvastatin to 40mg  daily   *If you need a refill on your cardiac medications before your next appointment, please call your pharmacy*  Follow-Up: At [facility], you and your health needs are our priority.  As part of our continuing mission to provide you with exceptional heart care, we have created  designated Provider Care Teams.  These Care Teams include your primary Cardiologist (physician) and Advanced Practice Providers (APPs -  Physician Assistants and Nurse Practitioners) who all work together to provide you with the care you need, when you need it.  We recommend signing up for the patient portal called "MyChart".  Sign up information is provided on this After Visit Summary.  MyChart is used to connect with patients for Virtual Visits (Telemedicine).  Patients are able to view lab/test results, encounter notes, upcoming appointments, etc.  Non-urgent messages can be sent to your provider as well.   To learn more about what you can do with MyChart, go to [facility].    Your next appointment:   As needed  The format for your next appointment:   In Person  Provider:    Other Instructions            Signed, [email or web address], MD  05/28/2022 3:22 PM    [facility]

## 2022-05-28 NOTE — Patient Instructions (Signed)
Medication Instructions:  Your physician has recommended you make the following change in your medication:  Increase Crestor/rosuvastatin to 40mg  daily   *If you need a refill on your cardiac medications before your next appointment, please call your pharmacy*  Follow-Up: At [facility], you and your health needs are our priority.  As part of our continuing mission to provide you with exceptional heart care, we have created designated Provider Care Teams.  These Care Teams include your primary Cardiologist (physician) and Advanced Practice Providers (APPs -  Physician Assistants and Nurse Practitioners) who all work together to provide you with the care you need, when you need it.  We recommend signing up for the patient portal called "MyChart".  Sign up information is provided on this After Visit Summary.  MyChart is used to connect with patients for Virtual Visits (Telemedicine).  Patients are able to view lab/test results, encounter notes, upcoming appointments, etc.  Non-urgent messages can be sent to your provider as well.   To learn more about what you can do with MyChart, go to [email or web address].    Your next appointment:   As needed  The format for your next appointment:   In Person  Provider:    Other Instructions

## 2023-06-13 ENCOUNTER — Other Ambulatory Visit: Payer: Self-pay

## 2023-06-13 MED ORDER — ROSUVASTATIN CALCIUM 40 MG PO TABS: 40.0000 mg | ORAL_TABLET | Freq: Every day | ORAL | 0 refills | Status: DC

## 2023-10-02 IMAGING — US US EXTREM LOW VENOUS*L*
1 series · 13 of 24 positions shown · non-contrast
Comparison: None.

CLINICAL DATA: 61-year-old female with a history of left leg
swelling



[Series 1: us extrem low venous*left* · 0.06mm/px · 13 of 50 slices shown]
[im 1/50]
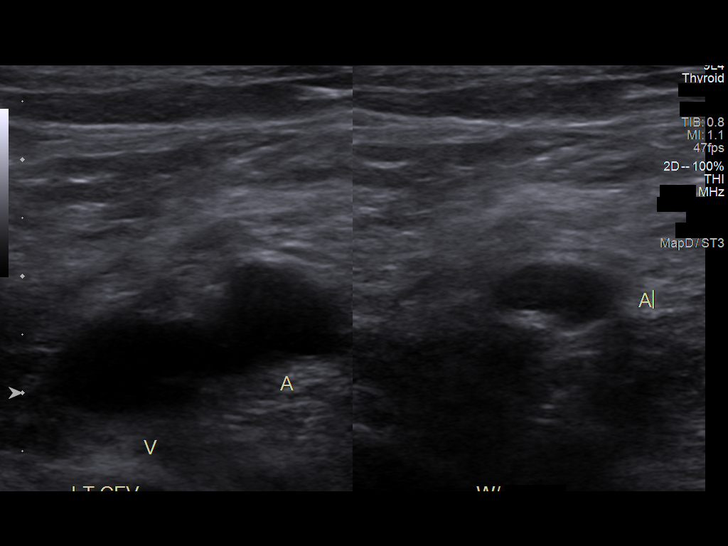
[im 5/50]
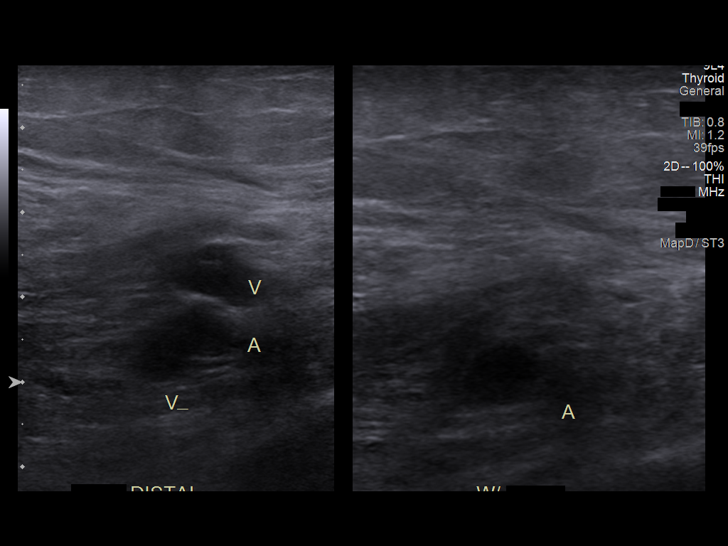
[im 9/50]
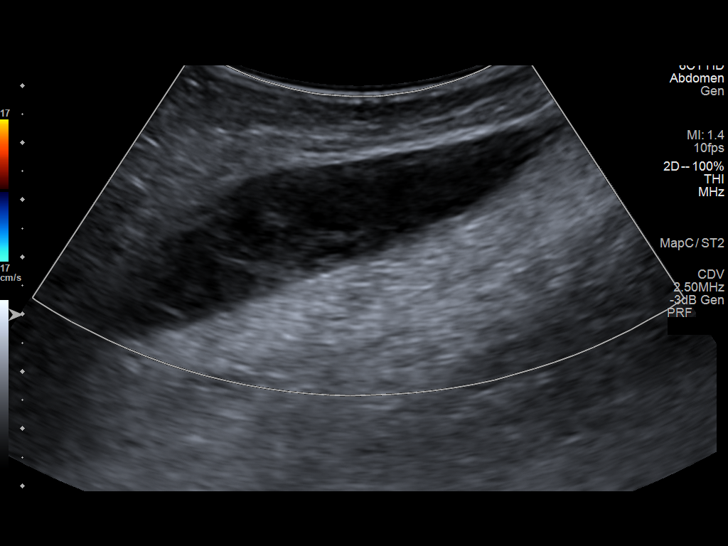
[im 13/50]
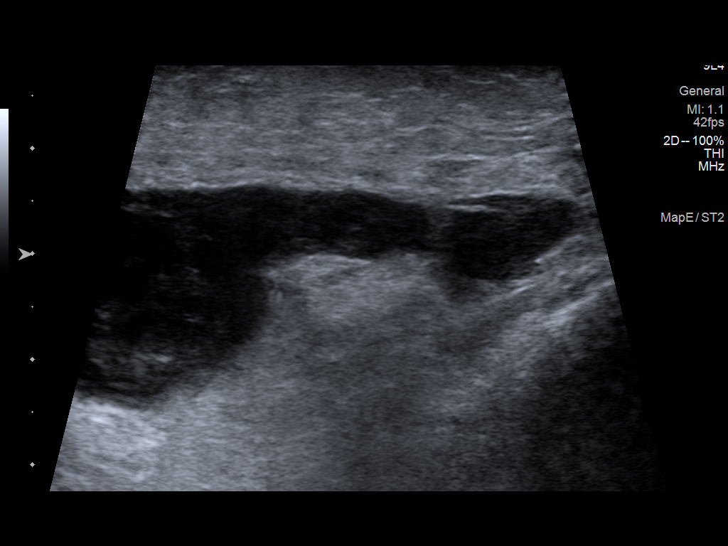
[im 18/50]
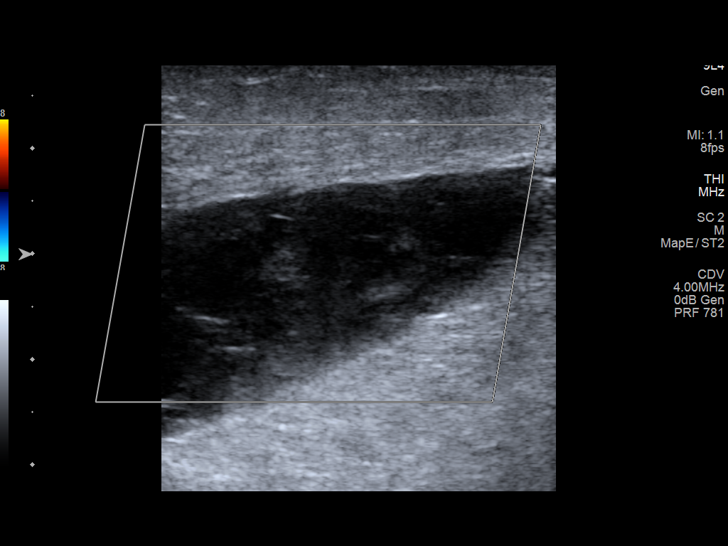
[im 22/50]
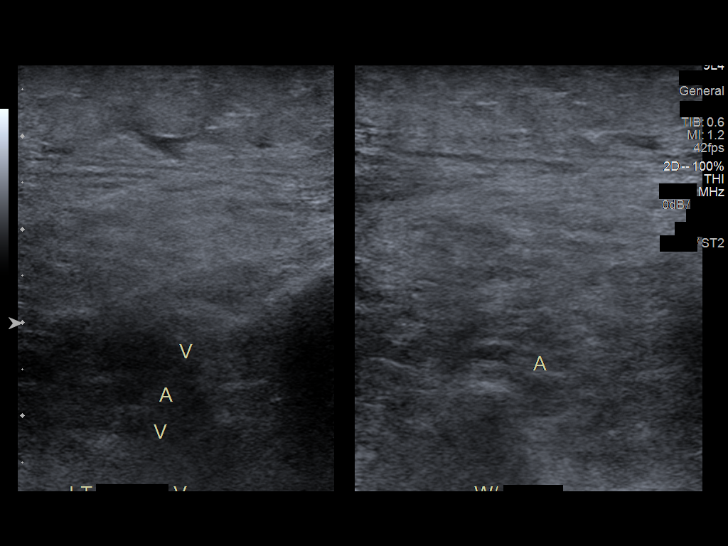
[im 26/50]
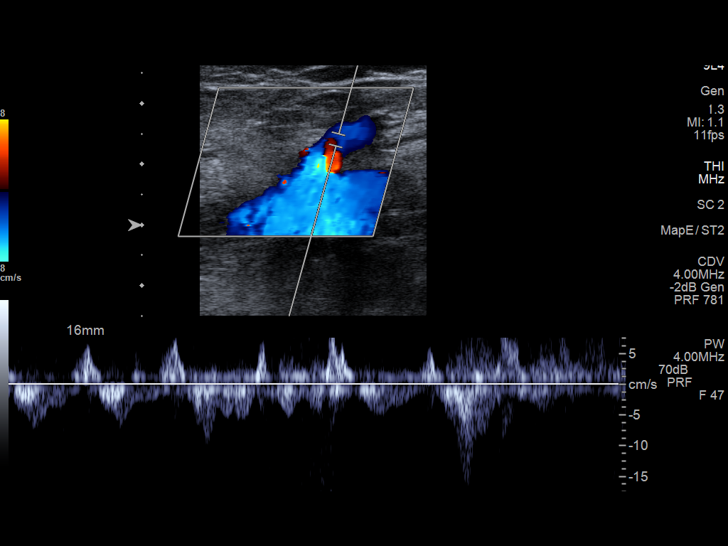
[im 28/50]
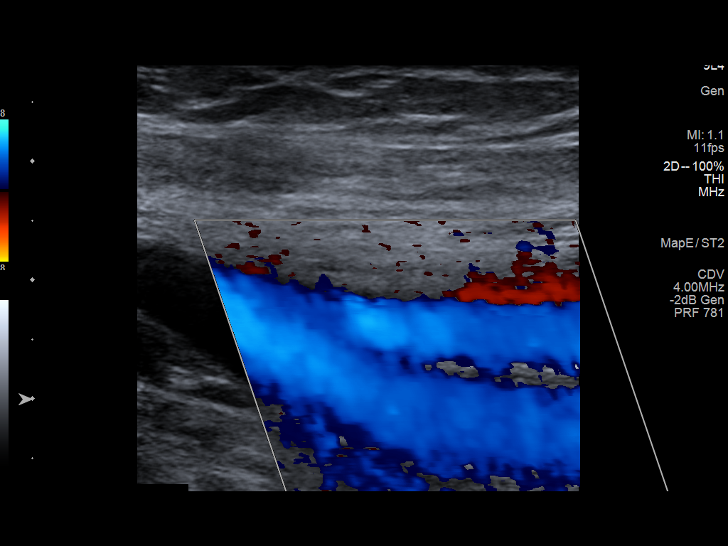
[im 32/50]
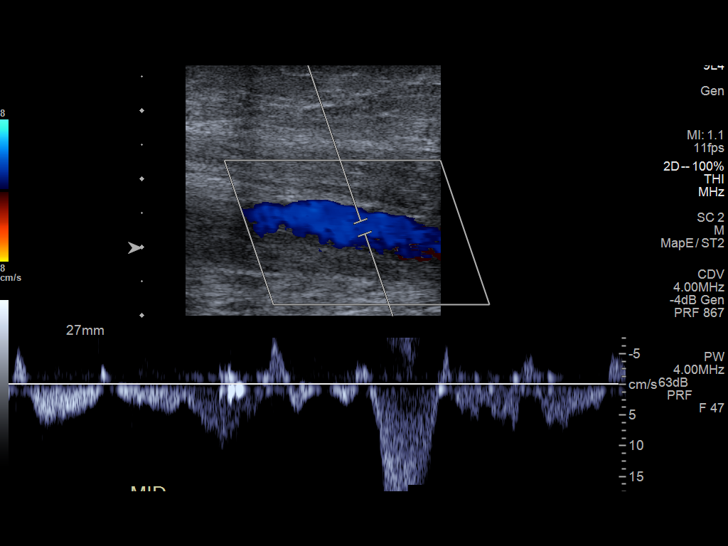
[im 37/50]
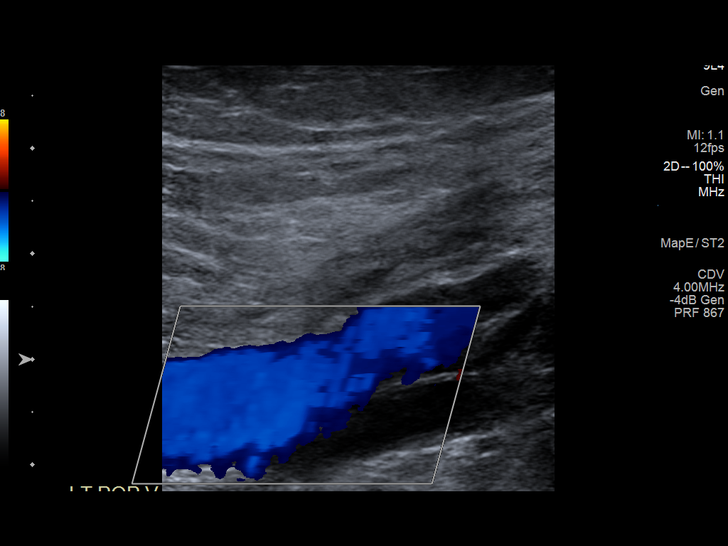
[im 41/50]
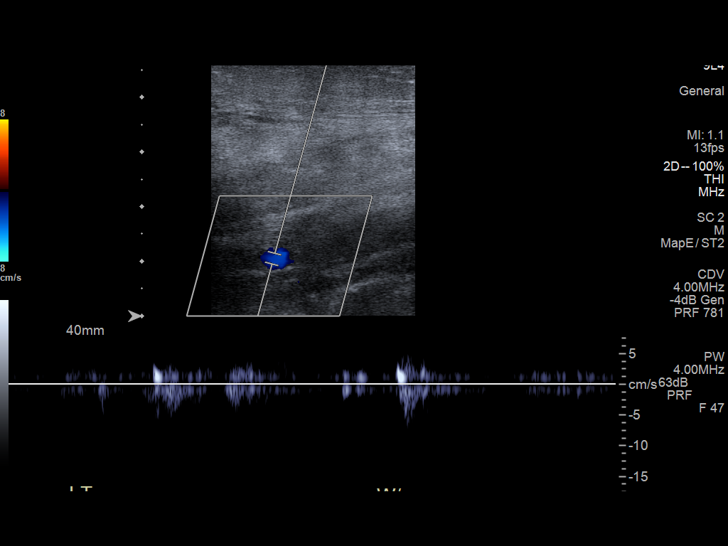
[im 45/50]
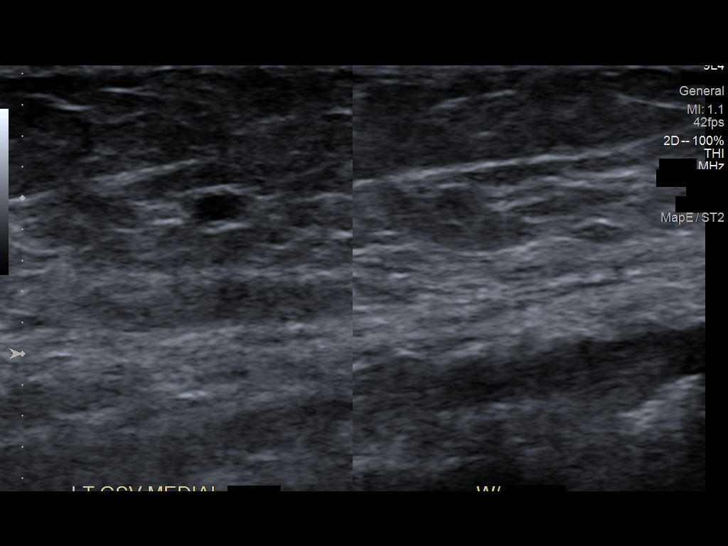
[im 50/50]
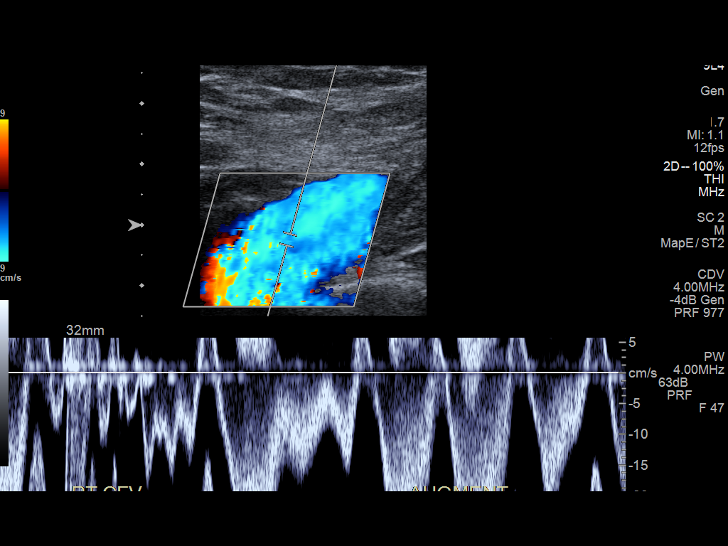

[13 of 24 positions shown; findings below may reference images not displayed]

FINDINGS: Contralateral Common Femoral Vein: Respiratory phasicity is normal
and symmetric with the symptomatic side. No evidence of thrombus.
Normal compressibility.

Common Femoral Vein: No evidence of thrombus. Normal
compressibility, respiratory phasicity and response to augmentation.

Saphenofemoral Junction: No evidence of thrombus. Normal
compressibility and flow on color Doppler imaging.

Profunda Femoral Vein: No evidence of thrombus. Normal
compressibility and flow on color Doppler imaging.

Femoral Vein: No evidence of thrombus. Normal compressibility,
respiratory phasicity and response to augmentation.

Popliteal Vein: No evidence of thrombus. Normal compressibility,
respiratory phasicity and response to augmentation.

Calf Veins: No evidence of thrombus. Normal compressibility and flow
on color Doppler imaging.

Superficial Great Saphenous Vein: No evidence of thrombus. Normal
compressibility and flow on color Doppler imaging.

Other Findings: Lentiform soft tissue/fluid within the medial
proximal calf measuring 9.1 cm x 2.0 cm x 6.6 cm. Through
transmission with well deformed margin and no internal flow. Minimal
complexity of the fluid/tissue.
IMPRESSION: Directed duplex of the left lower extremity negative for DVT

Lentiform fluid collection/tissue within the proximal left calf
measuring 9.1 cm, most likely hematoma or seroma. Correlation with
any history of trauma or other physical exam finding may be useful.

## 2023-10-07 ENCOUNTER — Other Ambulatory Visit: Payer: Self-pay

## 2023-10-07 MED ORDER — ROSUVASTATIN CALCIUM 40 MG PO TABS: 40.0000 mg | ORAL_TABLET | Freq: Every day | ORAL | 0 refills | Status: AC

## 2024-02-24 LAB — LAB REPORT - SCANNED: EGFR: 65

## 2024-03-03 ENCOUNTER — Other Ambulatory Visit: Payer: Self-pay | Admitting: Registered Nurse

## 2024-03-03 DIAGNOSIS — R748 Abnormal levels of other serum enzymes: Secondary | ICD-10-CM

## 2024-03-09 ENCOUNTER — Ambulatory Visit
Admission: RE | Admit: 2024-03-09 | Discharge: 2024-03-09 | Disposition: A | Discharge status: Home / Self Care | Source: Ambulatory Visit | Attending: Registered Nurse | Admitting: Registered Nurse

## 2024-03-09 DIAGNOSIS — R748 Abnormal levels of other serum enzymes: Secondary | ICD-10-CM

## 2024-09-01 LAB — LAB REPORT - SCANNED: EGFR: 69
